# Patient Record
Sex: Male | Born: 1960 | Race: White | Hispanic: No | Marital: Married | Smoking: Current every day smoker
Health system: Southern US, Community
[De-identification: ages and names within clinical notes are randomized; demographics above are authoritative.]

## PROBLEM LIST (undated history)

## (undated) DIAGNOSIS — I2510 Atherosclerotic heart disease of native coronary artery without angina pectoris: Secondary | ICD-10-CM

## (undated) DIAGNOSIS — I10 Essential (primary) hypertension: Secondary | ICD-10-CM

## (undated) DIAGNOSIS — E119 Type 2 diabetes mellitus without complications: Secondary | ICD-10-CM

## (undated) DIAGNOSIS — Z955 Presence of coronary angioplasty implant and graft: Secondary | ICD-10-CM

## (undated) DIAGNOSIS — J449 Chronic obstructive pulmonary disease, unspecified: Secondary | ICD-10-CM

## (undated) HISTORY — PX: CARDIAC SURGERY: SHX584

## (undated) HISTORY — PX: ANKLE SURGERY: SHX546

---

## 2014-07-11 ENCOUNTER — Emergency Department (HOSPITAL_BASED_OUTPATIENT_CLINIC_OR_DEPARTMENT_OTHER): Payer: Managed Care, Other (non HMO)

## 2014-07-11 ENCOUNTER — Encounter (HOSPITAL_BASED_OUTPATIENT_CLINIC_OR_DEPARTMENT_OTHER): Payer: Self-pay | Admitting: *Deleted

## 2014-07-11 ENCOUNTER — Emergency Department (HOSPITAL_BASED_OUTPATIENT_CLINIC_OR_DEPARTMENT_OTHER)
Admission: EM | Admit: 2014-07-11 | Discharge: 2014-07-11 | Disposition: A | Discharge status: Home / Self Care | Payer: Managed Care, Other (non HMO) | Attending: Emergency Medicine | Admitting: Emergency Medicine

## 2014-07-11 DIAGNOSIS — J209 Acute bronchitis, unspecified: Secondary | ICD-10-CM | POA: Insufficient documentation

## 2014-07-11 DIAGNOSIS — Z720 Tobacco use: Secondary | ICD-10-CM | POA: Diagnosis not present

## 2014-07-11 DIAGNOSIS — J40 Bronchitis, not specified as acute or chronic: Secondary | ICD-10-CM

## 2014-07-11 DIAGNOSIS — R05 Cough: Secondary | ICD-10-CM | POA: Diagnosis present

## 2014-07-11 DIAGNOSIS — R062 Wheezing: Secondary | ICD-10-CM

## 2014-07-11 MED ORDER — ALBUTEROL SULFATE HFA 108 (90 BASE) MCG/ACT IN AERS
2.0000 | INHALATION_SPRAY | RESPIRATORY_TRACT | Status: DC | PRN
  Administered 2014-07-11: 2 via RESPIRATORY_TRACT
  Filled 2014-07-11: qty 6.7

## 2014-07-11 MED ORDER — ALBUTEROL SULFATE HFA 108 (90 BASE) MCG/ACT IN AERS: 2.0000 | INHALATION_SPRAY | RESPIRATORY_TRACT | Status: AC | PRN

## 2014-07-11 MED ORDER — ALBUTEROL SULFATE (2.5 MG/3ML) 0.083% IN NEBU
INHALATION_SOLUTION | RESPIRATORY_TRACT | Status: AC
  Administered 2014-07-11: 2.5 mg
  Filled 2014-07-11: qty 3

## 2014-07-11 MED ORDER — AZITHROMYCIN 250 MG PO TABS: 250.0000 mg | ORAL_TABLET | Freq: Every day | ORAL | Status: DC

## 2014-07-11 MED ORDER — HYDROCODONE-HOMATROPINE 5-1.5 MG/5ML PO SYRP: 5.0000 mL | ORAL_SOLUTION | Freq: Four times a day (QID) | ORAL | Status: AC | PRN

## 2014-07-11 MED ORDER — IPRATROPIUM-ALBUTEROL 0.5-2.5 (3) MG/3ML IN SOLN
RESPIRATORY_TRACT | Status: AC
  Administered 2014-07-11: 3 mL
  Filled 2014-07-11: qty 3

## 2014-07-11 NOTE — ED Notes (Signed)
Pt directed to pharmacy to pick up medications- inhaler given with instructions for use by RT

## 2014-07-11 NOTE — ED Notes (Signed)
Beaton MD at bedside. 

## 2014-07-11 NOTE — ED Provider Notes (Signed)
CSN: [id number]     Arrival date & time 07/11/14  1349 History   First MD Initiated Contact with Patient 07/11/14 1548     Chief Complaint  Patient presents with  . Cough  . Chest Pain      HPI States he has a productive cough green sputum and pain in his right lung for a week. Back pain into his left leg. Hx of pinched nerve that he was suppose to have back surgery for but has to wait for insurance approval. History reviewed. No pertinent past medical history. History reviewed. No pertinent past surgical history. No family history on file. History  Substance Use Topics  . Smoking status: Current Every Day Smoker -- 1.00 packs/day    Types: Cigarettes  . Smokeless tobacco: Not on file  . Alcohol Use: No    Review of Systems  All other systems reviewed and are negative.     Allergies  Review of patient's allergies indicates not on file.  Home Medications   Prior to Admission medications   Medication Sig Start Date End Date Taking? Authorizing Provider  azithromycin (ZITHROMAX) 250 MG tablet Take 1 tablet (250 mg total) by mouth daily. Take first 2 tablets together, then 1 every day until finished. 07/11/14   Nelva Nayobert Jalik Gellatly, MD  HYDROcodone-homatropine [facility](HYCODAN) 5-1.5 MG/5ML syrup Take 5 mLs by mouth every 6 (six) hours as needed for cough. 07/11/14   Nelva Nayobert Enez Monahan, MD   BP 194/94 mmHg  Pulse 94  Temp(Src) 97.7 F (36.5 C) (Oral)  Resp 22  Ht 5\' 10"  (1.778 m)  Wt 265 lb (120.203 kg)  BMI 38.02 kg/m2  SpO2 96% Physical Exam  Constitutional: He is oriented to person, place, and time. He appears well-developed and well-nourished. No distress.  HENT:  Head: Normocephalic and atraumatic.  Eyes: Pupils are equal, round, and reactive to light.  Neck: Normal range of motion.  Cardiovascular: Normal rate and intact distal pulses.   Pulmonary/Chest: No respiratory distress. He has wheezes.  Abdominal: Normal appearance. He exhibits no distension.  Musculoskeletal: Normal range  of motion.  Neurological: He is alert and oriented to person, place, and time. No cranial nerve deficit.  Skin: Skin is warm and dry. No rash noted.  Psychiatric: He has a normal mood and affect. His behavior is normal.  Nursing note and vitals reviewed.   ED Course  Procedures (including critical care time)  Medications  albuterol (PROVENTIL HFA;VENTOLIN HFA) 108 (90 BASE) MCG/ACT inhaler 2 puff (not administered)  albuterol (PROVENTIL) (2.5 MG/3ML) 0.083% nebulizer solution (2.5 mg  Given 07/11/14 1546)  ipratropium-albuterol (DUONEB) 0.5-2.5 (3) MG/3ML nebulizer solution (3 mLs  Given 07/11/14 1547)    Labs Review Labs Reviewed - No data to display  Imaging Review Dg Chest 2 View  07/11/2014   CLINICAL DATA:  Cough productive of green sputum, congestion, and RIGHT chest pain for 1 week, history smoking  EXAM: CHEST  2 VIEW  COMPARISON:  None  FINDINGS: Normal heart size, mediastinal contours, and pulmonary vascularity.  Mild peribronchial thickening.  No pulmonary infiltrate, pleural effusion, or pneumothorax.  Bones unremarkable.  IMPRESSION: Mild bronchitic changes without infiltrate.   Electronically Signed   By: Ulyses SouthwardMark  Boles M.D.   On: 07/11/2014 14:20     EKG Interpretation   Date/Time:  Thursday July 11 2014 14:01:33 EDT Ventricular Rate:  91 PR Interval:  172 QRS Duration: 98 QT Interval:  374 QTC Calculation: 460 R Axis:   74 Text Interpretation:  Normal sinus  rhythm Normal ECG Confirmed by Radford Pax   MD, Izel Hochberg ([id number]) on 07/11/2014 3:48:45 PM      MDM   Final diagnoses:  Bronchitis  Wheezing      Nelva Nay, MD 07/11/14 [phone]

## 2014-07-11 NOTE — ED Notes (Signed)
States he has a productive cough green sputum and pain in his right lung for a week. Back pain into his left leg. Hx of pinched nerve that he was suppose to have back surgery for but has to wait for insurance approval.

## 2016-08-28 ENCOUNTER — Other Ambulatory Visit: Payer: Self-pay

## 2016-08-28 ENCOUNTER — Emergency Department (HOSPITAL_BASED_OUTPATIENT_CLINIC_OR_DEPARTMENT_OTHER): Payer: Managed Care, Other (non HMO)

## 2016-08-28 ENCOUNTER — Emergency Department (HOSPITAL_BASED_OUTPATIENT_CLINIC_OR_DEPARTMENT_OTHER)
Admission: EM | Admit: 2016-08-28 | Discharge: 2016-08-28 | Disposition: A | Discharge status: Home / Self Care | Payer: Managed Care, Other (non HMO) | Attending: Emergency Medicine | Admitting: Emergency Medicine

## 2016-08-28 ENCOUNTER — Encounter (HOSPITAL_BASED_OUTPATIENT_CLINIC_OR_DEPARTMENT_OTHER): Payer: Self-pay | Admitting: Emergency Medicine

## 2016-08-28 DIAGNOSIS — Z79899 Other long term (current) drug therapy: Secondary | ICD-10-CM | POA: Diagnosis not present

## 2016-08-28 DIAGNOSIS — Z7984 Long term (current) use of oral hypoglycemic drugs: Secondary | ICD-10-CM | POA: Diagnosis not present

## 2016-08-28 DIAGNOSIS — I10 Essential (primary) hypertension: Secondary | ICD-10-CM | POA: Diagnosis not present

## 2016-08-28 DIAGNOSIS — E119 Type 2 diabetes mellitus without complications: Secondary | ICD-10-CM | POA: Insufficient documentation

## 2016-08-28 DIAGNOSIS — F17210 Nicotine dependence, cigarettes, uncomplicated: Secondary | ICD-10-CM | POA: Insufficient documentation

## 2016-08-28 DIAGNOSIS — R0602 Shortness of breath: Secondary | ICD-10-CM | POA: Diagnosis present

## 2016-08-28 DIAGNOSIS — J441 Chronic obstructive pulmonary disease with (acute) exacerbation: Secondary | ICD-10-CM

## 2016-08-28 HISTORY — DX: Essential (primary) hypertension: I10

## 2016-08-28 HISTORY — DX: Chronic obstructive pulmonary disease, unspecified: J44.9

## 2016-08-28 HISTORY — DX: Type 2 diabetes mellitus without complications: E11.9

## 2016-08-28 LAB — BASIC METABOLIC PANEL
Anion gap: 9 (ref 5–15)
BUN: 10 mg/dL (ref 6–20)
CO2: 30 mmol/L (ref 22–32)
Calcium: 9.2 mg/dL (ref 8.9–10.3)
Chloride: 100 mmol/L — ABNORMAL LOW (ref 101–111)
Creatinine, Ser: 0.72 mg/dL (ref 0.61–1.24)
GFR calc Af Amer: >60 mL/min (ref >60)
Glucose, Bld: 147 mg/dL — ABNORMAL HIGH (ref 65–99)
POTASSIUM: 3.8 mmol/L (ref 3.5–5.1)
Sodium: 139 mmol/L (ref 135–145)

## 2016-08-28 LAB — CBC WITH DIFFERENTIAL/PLATELET
BASOS ABS: 0 10*3/uL (ref 0.0–0.1)
Basophils Relative: 0 %
Eosinophils Absolute: 0.3 10*3/uL (ref 0.0–0.7)
Eosinophils Relative: 2 %
HEMATOCRIT: 44.3 % (ref 39.0–52.0)
Hemoglobin: 15.4 g/dL (ref 13.0–17.0)
LYMPHS ABS: 2.3 10*3/uL (ref 0.7–4.0)
LYMPHS PCT: 14 %
MCH: 30.4 pg (ref 26.0–34.0)
MCHC: 34.8 g/dL (ref 30.0–36.0)
MCV: 87.5 fL (ref 78.0–100.0)
MONO ABS: 1.6 10*3/uL — AB (ref 0.1–1.0)
MONOS PCT: 10 %
NEUTROS ABS: 12.1 10*3/uL — AB (ref 1.7–7.7)
Neutrophils Relative %: 74 %
Platelets: 314 10*3/uL (ref 150–400)
RBC: 5.06 MIL/uL (ref 4.22–5.81)
RDW: 14 % (ref 11.5–15.5)
WBC: 16.2 10*3/uL — ABNORMAL HIGH (ref 4.0–10.5)

## 2016-08-28 LAB — TROPONIN I: Troponin I: <0.03 ng/mL (ref <0.03)

## 2016-08-28 MED ORDER — IPRATROPIUM-ALBUTEROL 0.5-2.5 (3) MG/3ML IN SOLN
3.0000 mL | Freq: Once | RESPIRATORY_TRACT | Status: AC
  Administered 2016-08-28: 3 mL via RESPIRATORY_TRACT
  Filled 2016-08-28: qty 3

## 2016-08-28 MED ORDER — PREDNISONE 50 MG PO TABS
60.0000 mg | ORAL_TABLET | Freq: Once | ORAL | Status: AC
  Administered 2016-08-28: 60 mg via ORAL
  Filled 2016-08-28: qty 1

## 2016-08-28 MED ORDER — BENZONATATE 100 MG PO CAPS
100.0000 mg | ORAL_CAPSULE | Freq: Once | ORAL | Status: AC
  Administered 2016-08-28: 100 mg via ORAL
  Filled 2016-08-28: qty 1

## 2016-08-28 MED ORDER — IPRATROPIUM-ALBUTEROL 0.5-2.5 (3) MG/3ML IN SOLN
RESPIRATORY_TRACT | Status: AC
  Administered 2016-08-28: 3 mL
  Filled 2016-08-28: qty 3

## 2016-08-28 MED ORDER — ALBUTEROL SULFATE HFA 108 (90 BASE) MCG/ACT IN AERS: 1.0000 | INHALATION_SPRAY | Freq: Four times a day (QID) | RESPIRATORY_TRACT | 0 refills | Status: AC | PRN

## 2016-08-28 MED ORDER — ALBUTEROL SULFATE (2.5 MG/3ML) 0.083% IN NEBU
INHALATION_SOLUTION | RESPIRATORY_TRACT | Status: AC
  Administered 2016-08-28: 2.5 mg
  Filled 2016-08-28: qty 3

## 2016-08-28 MED ORDER — BENZONATATE 100 MG PO CAPS: 100.0000 mg | ORAL_CAPSULE | Freq: Three times a day (TID) | ORAL | 0 refills | Status: AC

## 2016-08-28 MED ORDER — AZITHROMYCIN 250 MG PO TABS: 250.0000 mg | ORAL_TABLET | Freq: Every day | ORAL | 0 refills | Status: DC

## 2016-08-28 MED ORDER — PREDNISONE 20 MG PO TABS: 40.0000 mg | ORAL_TABLET | Freq: Every day | ORAL | 0 refills | Status: DC

## 2016-08-28 NOTE — Discharge Instructions (Signed)
All of your labs and imaging has been reassuring. This is likely an exacerbation of her COPD. Take Tessalon for cough. Take the azithromycin which is antibiotic. Use the albuterol inhaler as needed. Take prednisone starting tomorrow for 3 days. Make sure you keep an eye on your sugars. Return if your symptoms worsen.

## 2016-08-28 NOTE — ED Notes (Signed)
RT called for eval due to wheezing.

## 2016-08-28 NOTE — ED Triage Notes (Signed)
SOB x 1 week

## 2016-08-28 NOTE — ED Notes (Signed)
Sat 92-94% while walking. No SOB noted.

## 2016-08-29 NOTE — ED Provider Notes (Signed)
[facility]-EMERGENCY DEPT Provider Note   CSN: [id number] Arrival date & time: 08/28/16  1110     History   Chief Complaint Chief Complaint  Patient presents with  . Shortness of Breath    HPI George Golden is a 56 y.o. male.  HPI 56 year old Caucasian male past medical history significant for hypertension, diabetes, COPD, tobacco abuse, chronic bronchitis presents to the emergency Department today with complaints of productive coughwith thick yellow sputum that has increased over the past week. Patient states that for the past week he's had this productive cough with increased amount of sputum production. Patient also states that he has chest congestion and mild shortness of breath associated with the congestion. He denies any chest pain. States that he gets chronic bronchitis once a year and this feels very similar. Feels like he needs a breathing treatment.patient denies any sick contacts. Does endorse nasal congestion and rhinorrhea. Patient denies any fever, chills, shortness of breath, lightheadedness, dizziness, nausea, vomiting, abdominal pain, urinary symptoms, sick contacts, lower extremity edema. Patient denies any history of DVT/PE, prolonged immobilizations, recent hospitalizations/surgeries, lower extremity  edema or calf tenderness.  Patient states he is a multi pack year smoker and that this is contributing to his symptoms. He is trying to quit smoking. Past Medical History:  Diagnosis Date  . COPD (chronic obstructive pulmonary disease) (HCC)   . Diabetes mellitus without complication (HCC)   . Hypertension     There are no active problems to display for this patient.   History reviewed. No pertinent surgical history.     Home Medications    Prior to Admission medications   Medication Sig Start Date End Date Taking? Authorizing Provider  albuterol (PROVENTIL HFA;VENTOLIN HFA) 108 (90 BASE) MCG/ACT inhaler Inhale 2 puffs into the lungs every 4 (four) hours as  needed for wheezing or shortness of breath. 07/11/14  Yes Nelva NayBeaton, Robert, MD  atorvastatin (LIPITOR) 20 MG tablet Take 20 mg by mouth daily.   Yes [provider]  lisinopril (PRINIVIL,ZESTRIL) 10 MG tablet Take 10 mg by mouth daily.   Yes [provider]  metFORMIN (GLUCOPHAGE) 500 MG tablet Take by mouth 2 (two) times daily with a meal.   Yes [provider]  metoprolol tartrate (LOPRESSOR) 25 MG tablet Take 25 mg by mouth 2 (two) times daily.   Yes [provider]  albuterol (PROVENTIL HFA;VENTOLIN HFA) 108 (90 Base) MCG/ACT inhaler Inhale 1-2 puffs into the lungs every 6 (six) hours as needed for wheezing or shortness of breath. 08/28/16   Leaphart, Lynann BeaverKenneth T, PA-C  azithromycin (ZITHROMAX) 250 MG tablet Take 1 tablet (250 mg total) by mouth daily. Take first 2 tablets together, then 1 every day until finished. 08/28/16   Rise MuLeaphart, Kenneth T, PA-C  benzonatate (TESSALON) 100 MG capsule Take 1 capsule (100 mg total) by mouth every 8 (eight) hours. 08/28/16   Rise MuLeaphart, Kenneth T, PA-C  HYDROcodone-homatropine (HYCODAN) 5-1.5 MG/5ML syrup Take 5 mLs by mouth every 6 (six) hours as needed for cough. 07/11/14   Nelva NayBeaton, Robert, MD  predniSONE (DELTASONE) 20 MG tablet Take 2 tablets (40 mg total) by mouth daily with breakfast. 08/28/16   Rise MuLeaphart, Kenneth T, PA-C    Family History No family history on file.  Social History Social History  Substance Use Topics  . Smoking status: Current Every Day Smoker    Packs/day: 1.00    Types: Cigarettes  . Smokeless tobacco: Never Used  . Alcohol use No     Allergies  Patient has no known allergies.   Review of Systems Review of Systems  Constitutional: Negative for chills and fever.  HENT: Positive for congestion and rhinorrhea. Negative for sore throat.   Eyes: Negative for visual disturbance.  Respiratory: Positive for cough, shortness of breath and wheezing.   Cardiovascular: Negative for chest pain, palpitations  and leg swelling.  Gastrointestinal: Negative for abdominal pain, nausea and vomiting.  Genitourinary: Negative for dysuria, flank pain, frequency, hematuria and urgency.  Musculoskeletal: Negative for back pain.  Neurological: Negative for dizziness, syncope, weakness, light-headedness and headaches.     Physical Exam Updated Vital Signs BP (!) 168/75   Pulse 80   Temp 98.8 F (37.1 C) (Oral)   Resp (!) 31   Ht 5\' 10"  (1.778 m)   Wt 124.7 kg (275 lb)   SpO2 93%   BMI 39.46 kg/m   Physical Exam  Constitutional: He appears well-developed and well-nourished. No distress.  Patient is a well-appearing male in no acute distress and nontoxic-appearing.  HENT:  Head: Normocephalic and atraumatic.  Right Ear: Tympanic membrane, external ear and ear canal normal.  Left Ear: Tympanic membrane, external ear and ear canal normal.  Nose: Mucosal edema and rhinorrhea present.  Mouth/Throat: Uvula is midline, oropharynx is clear and moist and mucous membranes are normal.  Eyes: Conjunctivae are normal. Right eye exhibits no discharge. Left eye exhibits no discharge. No scleral icterus.  Neck: Normal range of motion. Neck supple. No thyromegaly present.  Cardiovascular: Normal rate, regular rhythm, normal heart sounds and intact distal pulses.  Exam reveals no gallop and no friction rub.   No murmur heard. Pulmonary/Chest: Effort normal. No respiratory distress. He has wheezes. He has no rales. He exhibits no tenderness.  Diffuse expiratory wheezes noted along with diffuse coarse sounds throughout all lung fields. Productive cough of thick yellow sputum noted.  Abdominal: Soft. Bowel sounds are normal. He exhibits no distension. There is no tenderness. There is no rebound and no guarding.  Musculoskeletal: Normal range of motion.  Lymphadenopathy:    He has no cervical adenopathy.  Neurological: He is alert.  Skin: Skin is warm and dry. Capillary refill takes less than 2 seconds.  Nursing  note and vitals reviewed.    ED Treatments / Results  Labs (all labs ordered are listed, but only abnormal results are displayed) Labs Reviewed  BASIC METABOLIC PANEL - Abnormal; Notable for the following:       Result Value   Chloride 100 (*)    Glucose, Bld 147 (*)    All other components within normal limits  CBC WITH DIFFERENTIAL/PLATELET - Abnormal; Notable for the following:    WBC 16.2 (*)    Neutro Abs 12.1 (*)    Monocytes Absolute 1.6 (*)    All other components within normal limits  TROPONIN I    EKG  EKG Interpretation None       Radiology Dg Chest 2 View  Result Date: 08/28/2016 CLINICAL DATA:  Wheezing, cough and congestion for 1 week, hypertension, diabetes mellitus, COPD EXAM: CHEST  2 VIEW COMPARISON:  07/11/2014 FINDINGS: Normal heart size, mediastinal contours, and pulmonary vascularity. Mild chronic bronchitic changes. Lungs otherwise clear. No pleural effusion or pneumothorax. No acute bony abnormalities. IMPRESSION: Mild chronic bronchitic changes without infiltrate. Electronically Signed   By: Ulyses Southward M.D.   On: 08/28/2016 11:46    Procedures Procedures (including critical care time)  Medications Ordered in ED Medications  albuterol (PROVENTIL) (2.5 MG/3ML) 0.083% nebulizer solution (2.5  mg  Given 08/28/16 1122)  ipratropium-albuterol (DUONEB) 0.5-2.5 (3) MG/3ML nebulizer solution (3 mLs  Given 08/28/16 1122)  ipratropium-albuterol (DUONEB) 0.5-2.5 (3) MG/3ML nebulizer solution 3 mL (3 mLs Nebulization Given 08/28/16 1311)  predniSONE (DELTASONE) tablet 60 mg (60 mg Oral Given 08/28/16 1257)  benzonatate (TESSALON) capsule 100 mg (100 mg Oral Given 08/28/16 1257)     Initial Impression / Assessment and Plan / ED Course  I have reviewed the triage vital signs and the nursing notes.  Pertinent labs & imaging results that were available during my care of the patient were reviewed by me and considered in my medical decision making (see chart for  details).     Patient presents to the emergency department today with history of COPD, chronic tobacco use, chronic bronchitis for productive cough, chest congestion, rhinorrhea, increased shortness of breath over the past week. Patient denies any chest pain or fevers. On exam the patient does have diffuse wheezes and course sounds noted bilaterally otherwise patient is well-appearing.  Chest x-ray shows chronic bronchitic changes without overlying infiltrate. Patient was given 2 DuoNeb with complete resolution of the wheezing. Patient does have thick yellow sputum production with his cough.labs revealed a mild leukocytosis of 16,000. All other labs are at baseline. Troponin is negative. EKG shows no ischemic changes as reviewed by myself and Dr. Madilyn Hook. Doubt ACS. Patient is low risk for DVT/PE however he has PERC positive due to age and sp52. The patient is a chronic smoker and his SPO2 is likely in the low 90s. Has no other risk factors low suspicion for PE/DVT at this time. Patient feels much improved after breathing treatment, steroids and cough medicine, He was able to ambulate in the department with saturations above 92% on room air and feels like his breathing is back to baseline. Feels ready for discharge.  This is likely chronic bronchitis. He'll be treated with steroids, albuterol inhaler, antibiotics. Counseled patient on need for cessation of tobacco use. The patient is nontoxic appearing. Vital signs remained stable.Patient verbalized understanding the plan of care. All questions were answered prior to discharge. He has been given strict return precautions. Patient discussed with Dr. Madilyn Hook who is agreeable to the above plan.  Final Clinical Impressions(s) / ED Diagnoses   Final diagnoses:  COPD exacerbation (HCC)    New Prescriptions Discharge Medication List as of 08/28/2016  2:39 PM    START taking these medications   Details  !! albuterol (PROVENTIL HFA;VENTOLIN HFA) 108 (90 Base)  MCG/ACT inhaler Inhale 1-2 puffs into the lungs every 6 (six) hours as needed for wheezing or shortness of breath., Starting Sat 08/28/2016, Print    benzonatate (TESSALON) 100 MG capsule Take 1 capsule (100 mg total) by mouth every 8 (eight) hours., Starting Sat 08/28/2016, Print    predniSONE (DELTASONE) 20 MG tablet Take 2 tablets (40 mg total) by mouth daily with breakfast., Starting Sat 08/28/2016, Print     !! - Potential duplicate medications found. Please discuss with provider.       Rise Mu, PA-C 08/29/16 0855    Tilden Fossa, MD 08/29/16 [phone]

## 2019-12-30 ENCOUNTER — Emergency Department (HOSPITAL_BASED_OUTPATIENT_CLINIC_OR_DEPARTMENT_OTHER): Payer: Self-pay

## 2019-12-30 ENCOUNTER — Encounter (HOSPITAL_BASED_OUTPATIENT_CLINIC_OR_DEPARTMENT_OTHER): Payer: Self-pay | Admitting: Emergency Medicine

## 2019-12-30 ENCOUNTER — Other Ambulatory Visit: Payer: Self-pay

## 2019-12-30 ENCOUNTER — Emergency Department (HOSPITAL_BASED_OUTPATIENT_CLINIC_OR_DEPARTMENT_OTHER)
Admission: EM | Admit: 2019-12-30 | Discharge: 2019-12-30 | Disposition: A | Discharge status: Home / Self Care | Payer: Self-pay | Attending: Emergency Medicine | Admitting: Emergency Medicine

## 2019-12-30 DIAGNOSIS — F17210 Nicotine dependence, cigarettes, uncomplicated: Secondary | ICD-10-CM | POA: Insufficient documentation

## 2019-12-30 DIAGNOSIS — Z79899 Other long term (current) drug therapy: Secondary | ICD-10-CM | POA: Insufficient documentation

## 2019-12-30 DIAGNOSIS — E119 Type 2 diabetes mellitus without complications: Secondary | ICD-10-CM | POA: Insufficient documentation

## 2019-12-30 DIAGNOSIS — Z7984 Long term (current) use of oral hypoglycemic drugs: Secondary | ICD-10-CM | POA: Insufficient documentation

## 2019-12-30 DIAGNOSIS — I10 Essential (primary) hypertension: Secondary | ICD-10-CM | POA: Insufficient documentation

## 2019-12-30 DIAGNOSIS — J441 Chronic obstructive pulmonary disease with (acute) exacerbation: Secondary | ICD-10-CM | POA: Insufficient documentation

## 2019-12-30 LAB — CBC
HCT: 43.6 % (ref 39.0–52.0)
Hemoglobin: 14.1 g/dL (ref 13.0–17.0)
MCH: 29.3 pg (ref 26.0–34.0)
MCHC: 32.3 g/dL (ref 30.0–36.0)
MCV: 90.5 fL (ref 80.0–100.0)
Platelets: 198 10*3/uL (ref 150–400)
RBC: 4.82 MIL/uL (ref 4.22–5.81)
RDW: 14.9 % (ref 11.5–15.5)
WBC: 11.2 10*3/uL — ABNORMAL HIGH (ref 4.0–10.5)
nRBC: 0 % (ref 0.0–0.2)

## 2019-12-30 LAB — BASIC METABOLIC PANEL
Anion gap: 9 (ref 5–15)
BUN: 18 mg/dL (ref 6–20)
CO2: 28 mmol/L (ref 22–32)
Calcium: 8.8 mg/dL — ABNORMAL LOW (ref 8.9–10.3)
Chloride: 103 mmol/L (ref 98–111)
Creatinine, Ser: 0.92 mg/dL (ref 0.61–1.24)
GFR calc Af Amer: >60 mL/min (ref >60)
GFR calc non Af Amer: >60 mL/min (ref >60)
Glucose, Bld: 144 mg/dL — ABNORMAL HIGH (ref 70–99)
Potassium: 3.9 mmol/L (ref 3.5–5.1)
Sodium: 140 mmol/L (ref 135–145)

## 2019-12-30 LAB — TROPONIN I (HIGH SENSITIVITY): Troponin I (High Sensitivity): 4 ng/L (ref <18)

## 2019-12-30 MED ORDER — METHYLPREDNISOLONE SODIUM SUCC 125 MG IJ SOLR
125.0000 mg | Freq: Once | INTRAMUSCULAR | Status: AC
  Administered 2019-12-30: 125 mg via INTRAVENOUS
  Filled 2019-12-30: qty 2

## 2019-12-30 MED ORDER — AZITHROMYCIN 250 MG PO TABS: 250.0000 mg | ORAL_TABLET | Freq: Every day | ORAL | 0 refills | Status: AC

## 2019-12-30 MED ORDER — IPRATROPIUM BROMIDE HFA 17 MCG/ACT IN AERS
4.0000 | INHALATION_SPRAY | Freq: Once | RESPIRATORY_TRACT | Status: AC
  Administered 2019-12-30: 4 via RESPIRATORY_TRACT
  Filled 2019-12-30: qty 12.9

## 2019-12-30 MED ORDER — AEROCHAMBER PLUS FLO-VU MEDIUM MISC
1.0000 | Freq: Once | Status: AC
  Administered 2019-12-30: 1
  Filled 2019-12-30: qty 1

## 2019-12-30 MED ORDER — AZITHROMYCIN 250 MG PO TABS
500.0000 mg | ORAL_TABLET | Freq: Once | ORAL | Status: AC
  Administered 2019-12-30: 500 mg via ORAL
  Filled 2019-12-30: qty 2

## 2019-12-30 MED ORDER — ALBUTEROL SULFATE HFA 108 (90 BASE) MCG/ACT IN AERS
8.0000 | INHALATION_SPRAY | Freq: Once | RESPIRATORY_TRACT | Status: AC
  Administered 2019-12-30: 8 via RESPIRATORY_TRACT
  Filled 2019-12-30: qty 6.7

## 2019-12-30 MED ORDER — PREDNISONE 20 MG PO TABS: 40.0000 mg | ORAL_TABLET | Freq: Every day | ORAL | 0 refills | Status: AC

## 2019-12-30 NOTE — Discharge Instructions (Signed)
Please read instructions below. Take the antibiotic, azithromycin, starting tomorrow, once daily until they are gone. Starting tomorrow, take the prednisone daily. Monitor your blood sugar while taking. You can use your albuterol inhaler as needed for wheezing or shortness of breath. Schedule an appointment with your primary care provider to follow up on your visit today. Return to the ER for worsening shortness of breath, if your oxygen percentage drops below 90%, chest pain on exertion, fever, or new or worsening symptoms.

## 2019-12-30 NOTE — ED Triage Notes (Signed)
Reports chest congestion with SOB for the last three days.  Hx of COPD.

## 2019-12-30 NOTE — ED Provider Notes (Addendum)
[facility] EMERGENCY DEPARTMENT Provider Note   CSN: [id number] Arrival date & time: 12/30/19  1855     History Chief Complaint  Patient presents with  . Shortness of Breath    George Golden is a 59 y.o. male past medical history of COPD, diabetes, hypertension, presenting the emergency department with complaint of 3 days of congested cough and shortness of breath.  Patient states symptoms feel very similar to COPD exacerbation for bronchitis.  He states he has this couple of times per year.  He denies fevers or chills though he has a general feeling of unwell.  He has no chest pain.  He feels congested in his chest, his cough is productive of yellow sputum.  He states generally steroids and antibiotics help him through these exacerbations.  He has had no Covid exposures.  He is vaccinated against Covid.  The history is provided by the patient.       Past Medical History:  Diagnosis Date  . COPD (chronic obstructive pulmonary disease) (HCC)   . Diabetes mellitus without complication (HCC)   . Hypertension     There are no problems to display for this patient.   History reviewed. No pertinent surgical history.     No family history on file.  Social History   Tobacco Use  . Smoking status: Current Every Day Smoker    Packs/day: 1.00    Types: Cigarettes  . Smokeless tobacco: Never Used  Substance Use Topics  . Alcohol use: No  . Drug use: No    Home Medications Prior to Admission medications   Medication Sig Start Date End Date Taking? Authorizing Provider  albuterol (PROVENTIL HFA;VENTOLIN HFA) 108 (90 BASE) MCG/ACT inhaler Inhale 2 puffs into the lungs every 4 (four) hours as needed for wheezing or shortness of breath. 07/11/14   Nelva Nay, MD  albuterol (PROVENTIL HFA;VENTOLIN HFA) 108 (90 Base) MCG/ACT inhaler Inhale 1-2 puffs into the lungs every 6 (six) hours as needed for wheezing or shortness of breath. 08/28/16   Leaphart, Lynann Beaver, PA-C    atorvastatin (LIPITOR) 20 MG tablet Take 20 mg by mouth daily.    [provider]  azithromycin (ZITHROMAX) 250 MG tablet Take 1 tablet (250 mg total) by mouth daily. Take 1 every day until finished. 12/31/19   Kimbly Eanes, [place] N, PA-C  benzonatate (TESSALON) 100 MG capsule Take 1 capsule (100 mg total) by mouth every 8 (eight) hours. 08/28/16   Rise Mu, PA-C  HYDROcodone-homatropine (HYCODAN) 5-1.5 MG/5ML syrup Take 5 mLs by mouth every 6 (six) hours as needed for cough. 07/11/14   Nelva Nay, MD  lisinopril (PRINIVIL,ZESTRIL) 10 MG tablet Take 10 mg by mouth daily.    [provider]  metFORMIN (GLUCOPHAGE) 500 MG tablet Take by mouth 2 (two) times daily with a meal.    [provider]  metoprolol tartrate (LOPRESSOR) 25 MG tablet Take 25 mg by mouth 2 (two) times daily.    [provider]  predniSONE (DELTASONE) 20 MG tablet Take 2 tablets (40 mg total) by mouth daily with breakfast for 4 days. 12/31/19 01/04/20  Jacobs Golab, [place] N, PA-C    Allergies    Patient has no known allergies.  Review of Systems   Review of Systems  Respiratory: Positive for cough, chest tightness and shortness of breath.   All other systems reviewed and are negative.   Physical Exam Updated Vital Signs BP (!) 190/81 (BP Location: Right Arm)   Pulse 82  Temp 97.9 F (36.6 C) (Oral)   Resp 18   Ht 5\' 10"  (1.778 m)   Wt 108.9 kg   SpO2 96%   BMI 34.44 kg/m   Physical Exam Vitals and nursing note reviewed.  Constitutional:      General: He is not in acute distress.    Appearance: He is well-developed. He is not ill-appearing.  HENT:     Head: Normocephalic and atraumatic.  Eyes:     Conjunctiva/sclera: Conjunctivae normal.  Cardiovascular:     Rate and Rhythm: Normal rate and regular rhythm.  Pulmonary:     Effort: Pulmonary effort is normal. No respiratory distress.     Comments: Diffuse wheezes throughout, lung sounds slightly diminished  bilaterally. Abdominal:     Palpations: Abdomen is soft.  Skin:    General: Skin is warm.  Neurological:     Mental Status: He is alert.  Psychiatric:        Behavior: Behavior normal.     ED Results / Procedures / Treatments   Labs (all labs ordered are listed, but only abnormal results are displayed) Labs Reviewed  BASIC METABOLIC PANEL - Abnormal; Notable for the following components:      Result Value   Glucose, Bld 144 (*)    Calcium 8.8 (*)    All other components within normal limits  CBC - Abnormal; Notable for the following components:   WBC 11.2 (*)    All other components within normal limits  TROPONIN I (HIGH SENSITIVITY)    EKG None  Radiology DG Chest 2 View  Result Date: 12/30/2019 CLINICAL DATA:  Shortness of breath.  Chest pain.  No fever. EXAM: CHEST - 2 VIEW COMPARISON:  June 24 21 FINDINGS: The heart size and mediastinal contours are within normal limits. Both lungs are clear. The visualized skeletal structures are unremarkable. There is a probable nipple shadow in the peripheral right lower lung zone. IMPRESSION: No active cardiopulmonary disease. Electronically Signed   By: June 26 M.D.   On: 12/30/2019 19:24    Procedures Procedures (including critical care time)  Medications Ordered in ED Medications  azithromycin (ZITHROMAX) tablet 500 mg (has no administration in time range)  albuterol (VENTOLIN HFA) 108 (90 Base) MCG/ACT inhaler 8 puff (8 puffs Inhalation Given 12/30/19 2156)  ipratropium (ATROVENT HFA) inhaler 4 puff (4 puffs Inhalation Given 12/30/19 2156)  AeroChamber Plus Flo-Vu Medium MISC 1 each (1 each Other Given 12/30/19 2156)  methylPREDNISolone sodium succinate (SOLU-MEDROL) 125 mg/2 mL injection 125 mg (125 mg Intravenous Given 12/30/19 2206)    ED Course  I have reviewed the triage vital signs and the nursing notes.  Pertinent labs & imaging results that were available during my care of the patient were reviewed by me  and considered in my medical decision making (see chart for details).    MDM Rules/Calculators/A&P                          George Golden was evaluated in Emergency Department on 12/30/2019 for the symptoms described in the history of present illness. He was evaluated in the context of the global COVID-19 pandemic, which necessitated consideration that the patient might be at risk for infection with the SARS-CoV-2 virus that causes COVID-19. Institutional protocols and algorithms that pertain to the evaluation of patients at risk for COVID-19 are in a state of rapid change based on information released by regulatory bodies including the CDC and federal and  state organizations. These policies and algorithms were followed during the patient's care in the ED.   Patient presenting with shortness of breath and productive cough for the last 3 days, likely with COPD exacerbation.  He has similar episodes a couple times per year, today feels the same.  No fevers or systemic symptoms.  Chest x-ray is negative for pneumonia.  Lung exam with diffuse wheezing, diminished breath sounds bilaterally.  He is treated with Solu-Medrol, albuterol and Atrovent with improvement in symptoms. Lungs sounds with improvement. He is saturating 95% on room air with ambulation and no increased respiratory effort.  Patient be discharged with azithromycin and short course of prednisone.  He is instructed to follow close with PCP.  Discussed reasons return to the ED. patient states he is a pulse oximeter at home to monitor his symptoms.  Offered Covid test, patient declined.  Discussed results, findings, treatment and follow up. Patient advised of return precautions. Patient verbalized understanding and agreed with plan.  Final Clinical Impression(s) / ED Diagnoses Final diagnoses:  COPD exacerbation (HCC)    Rx / DC Orders ED Discharge Orders         Ordered    azithromycin (ZITHROMAX) 250 MG tablet  Daily        12/30/19  2245    predniSONE (DELTASONE) 20 MG tablet  Daily with breakfast        12/30/19 2245           Kynsli Haapala, [place] N, PA-C 12/30/19 2248    Brayam Boeke, [place] N, PA-C 12/30/19 2253    Benjiman Core, MD 12/30/19 2337

## 2019-12-30 NOTE — ED Notes (Signed)
Pt ambulated while wearing pulse ox, O2 was between 94-96% on room air and pulse was 100-101; pt tolerated well; EDP made aware

## 2020-09-06 ENCOUNTER — Encounter (HOSPITAL_BASED_OUTPATIENT_CLINIC_OR_DEPARTMENT_OTHER): Payer: Self-pay | Admitting: *Deleted

## 2020-09-06 ENCOUNTER — Other Ambulatory Visit: Payer: Self-pay

## 2020-09-06 ENCOUNTER — Emergency Department (HOSPITAL_BASED_OUTPATIENT_CLINIC_OR_DEPARTMENT_OTHER)
Admission: EM | Admit: 2020-09-06 | Discharge: 2020-09-06 | Disposition: A | Discharge status: Home / Self Care | Payer: BC Managed Care – PPO | Attending: Emergency Medicine | Admitting: Emergency Medicine

## 2020-09-06 ENCOUNTER — Emergency Department (HOSPITAL_BASED_OUTPATIENT_CLINIC_OR_DEPARTMENT_OTHER): Payer: BC Managed Care – PPO

## 2020-09-06 DIAGNOSIS — Z79899 Other long term (current) drug therapy: Secondary | ICD-10-CM | POA: Diagnosis not present

## 2020-09-06 DIAGNOSIS — R079 Chest pain, unspecified: Secondary | ICD-10-CM | POA: Insufficient documentation

## 2020-09-06 DIAGNOSIS — J449 Chronic obstructive pulmonary disease, unspecified: Secondary | ICD-10-CM | POA: Diagnosis not present

## 2020-09-06 DIAGNOSIS — F17210 Nicotine dependence, cigarettes, uncomplicated: Secondary | ICD-10-CM | POA: Insufficient documentation

## 2020-09-06 DIAGNOSIS — E119 Type 2 diabetes mellitus without complications: Secondary | ICD-10-CM | POA: Insufficient documentation

## 2020-09-06 DIAGNOSIS — Z955 Presence of coronary angioplasty implant and graft: Secondary | ICD-10-CM | POA: Insufficient documentation

## 2020-09-06 DIAGNOSIS — I2510 Atherosclerotic heart disease of native coronary artery without angina pectoris: Secondary | ICD-10-CM | POA: Diagnosis not present

## 2020-09-06 DIAGNOSIS — R61 Generalized hyperhidrosis: Secondary | ICD-10-CM | POA: Diagnosis not present

## 2020-09-06 DIAGNOSIS — I10 Essential (primary) hypertension: Secondary | ICD-10-CM | POA: Insufficient documentation

## 2020-09-06 DIAGNOSIS — Z7984 Long term (current) use of oral hypoglycemic drugs: Secondary | ICD-10-CM | POA: Diagnosis not present

## 2020-09-06 HISTORY — DX: Atherosclerotic heart disease of native coronary artery without angina pectoris: I25.10

## 2020-09-06 HISTORY — DX: Presence of coronary angioplasty implant and graft: Z95.5

## 2020-09-06 LAB — BASIC METABOLIC PANEL
Anion gap: 8 (ref 5–15)
BUN: 21 mg/dL — ABNORMAL HIGH (ref 6–20)
CO2: 27 mmol/L (ref 22–32)
Calcium: 9.2 mg/dL (ref 8.9–10.3)
Chloride: 102 mmol/L (ref 98–111)
Creatinine, Ser: 1.05 mg/dL (ref 0.61–1.24)
GFR, Estimated: >60 mL/min (ref >60)
Glucose, Bld: 116 mg/dL — ABNORMAL HIGH (ref 70–99)
Potassium: 4 mmol/L (ref 3.5–5.1)
Sodium: 137 mmol/L (ref 135–145)

## 2020-09-06 LAB — TROPONIN I (HIGH SENSITIVITY)
Troponin I (High Sensitivity): 2 ng/L (ref <18)
Troponin I (High Sensitivity): 3 ng/L (ref <18)

## 2020-09-06 LAB — CBC
HCT: 45.2 % (ref 39.0–52.0)
Hemoglobin: 15.2 g/dL (ref 13.0–17.0)
MCH: 30.7 pg (ref 26.0–34.0)
MCHC: 33.6 g/dL (ref 30.0–36.0)
MCV: 91.3 fL (ref 80.0–100.0)
Platelets: 237 10*3/uL (ref 150–400)
RBC: 4.95 MIL/uL (ref 4.22–5.81)
RDW: 14.6 % (ref 11.5–15.5)
WBC: 10.3 10*3/uL (ref 4.0–10.5)
nRBC: 0 % (ref 0.0–0.2)

## 2020-09-06 MED ORDER — NITROGLYCERIN 0.4 MG SL SUBL
0.4000 mg | SUBLINGUAL_TABLET | SUBLINGUAL | Status: DC | PRN
  Administered 2020-09-06: 0.4 mg via SUBLINGUAL
  Filled 2020-09-06: qty 1

## 2020-09-06 MED ORDER — ASPIRIN 81 MG PO CHEW
324.0000 mg | CHEWABLE_TABLET | Freq: Once | ORAL | Status: AC
  Administered 2020-09-06: 324 mg via ORAL
  Filled 2020-09-06: qty 4

## 2020-09-06 MED ORDER — NITROGLYCERIN 0.4 MG SL SUBL: 0.4000 mg | SUBLINGUAL_TABLET | SUBLINGUAL | 0 refills | Status: AC | PRN

## 2020-09-06 NOTE — ED Triage Notes (Signed)
Pt reports chest pain x 3 days with radiation to neck and jaw. Hx of stent placement. His brother died earlier this year from a heart attack

## 2020-09-06 NOTE — ED Notes (Signed)
ED Provider at bedside. 

## 2020-09-06 NOTE — Discharge Instructions (Signed)
Make sure to take your medications as well and aspirin daily.  Contact your cardiologist on Monday to schedule a follow-up appointment.  Return to the hospital if you have any recurrent symptoms

## 2020-09-06 NOTE — ED Notes (Signed)
Pt states Chest pain fully relieved by Nitro #1.  Pt resting comfortably.

## 2020-09-06 NOTE — ED Provider Notes (Signed)
[facility] EMERGENCY DEPARTMENT Provider Note   CSN: [id number] Arrival date & time: 09/06/20  1646     History Chief Complaint  Patient presents with  . Chest Pain    George Golden is a 60 y.o. male.  HPI  HPI: A 60 year old patient with a history of treated diabetes and hypertension presents for evaluation of chest pain. Initial onset of pain was approximately 3-6 hours ago. The patient's chest pain is sharp and is not worse with exertion. The patient reports some diaphoresis. The patient's chest pain is middle- or left-sided, is not well-localized, is not described as heaviness/pressure/tightness and does radiate to the arms/jaw/neck. The patient does not complain of nausea. The patient has smoked in the past 90 days. The patient has no history of stroke, has no history of peripheral artery disease, has no relevant family history of coronary artery disease (first degree relative at less than age 59), has no history of hypercholesterolemia and does not have an elevated BMI (>=30).  Patient states the pain initially started when he was mowing the lawn.  It has been coming and going over the last 3 days.  He did have another episode this morning at about 11 AM.  He does have history of heart disease and had a stent placed Past Medical History:  Diagnosis Date  . COPD (chronic obstructive pulmonary disease) (HCC)   . Coronary artery disease   . Diabetes mellitus without complication (HCC)   . H/O heart artery stent   . Hypertension     There are no problems to display for this patient.   Past Surgical History:  Procedure Laterality Date  . ANKLE SURGERY Right   . CARDIAC SURGERY         No family history on file.  Social History   Tobacco Use  . Smoking status: Current Every Day Smoker    Packs/day: 1.00    Types: Cigarettes  . Smokeless tobacco: Never Used  Vaping Use  . Vaping Use: Never used  Substance Use Topics  . Alcohol use: No  . Drug use: No     Home Medications Prior to Admission medications   Medication Sig Start Date End Date Taking? Authorizing Provider  nitroGLYCERIN (NITROSTAT) 0.4 MG SL tablet Place 1 tablet (0.4 mg total) under the tongue every 5 (five) minutes as needed for chest pain. 09/06/20  Yes Linwood Dibbles, MD  albuterol (PROVENTIL HFA;VENTOLIN HFA) 108 (90 BASE) MCG/ACT inhaler Inhale 2 puffs into the lungs every 4 (four) hours as needed for wheezing or shortness of breath. 07/11/14   Nelva Nay, MD  albuterol (PROVENTIL HFA;VENTOLIN HFA) 108 (90 Base) MCG/ACT inhaler Inhale 1-2 puffs into the lungs every 6 (six) hours as needed for wheezing or shortness of breath. 08/28/16   Leaphart, Lynann Beaver, PA-C  atorvastatin (LIPITOR) 20 MG tablet Take 20 mg by mouth daily.    [provider]  azithromycin (ZITHROMAX) 250 MG tablet Take 1 tablet (250 mg total) by mouth daily. Take 1 every day until finished. 12/31/19   Robinson, [place] N, PA-C  benzonatate (TESSALON) 100 MG capsule Take 1 capsule (100 mg total) by mouth every 8 (eight) hours. 08/28/16   Rise Mu, PA-C  HYDROcodone-homatropine (HYCODAN) 5-1.5 MG/5ML syrup Take 5 mLs by mouth every 6 (six) hours as needed for cough. 07/11/14   Nelva Nay, MD  lisinopril (PRINIVIL,ZESTRIL) 10 MG tablet Take 10 mg by mouth daily.    [provider]  metFORMIN (GLUCOPHAGE) 500 MG  tablet Take by mouth 2 (two) times daily with a meal.    [provider]  metoprolol tartrate (LOPRESSOR) 25 MG tablet Take 25 mg by mouth 2 (two) times daily.    [provider]    Allergies    Patient has no known allergies.  Review of Systems   Review of Systems  All other systems reviewed and are negative.   Physical Exam Updated Vital Signs BP 137/76   Pulse 63   Temp 98.1 F (36.7 C) (Oral)   Resp 16   Ht 1.778 m (5\' 10" )   Wt 112.9 kg   SpO2 100%   BMI 35.73 kg/m   Physical Exam Vitals and nursing note reviewed.  Constitutional:       General: He is not in acute distress.    Appearance: He is well-developed.  HENT:     Head: Normocephalic and atraumatic.     Right Ear: External ear normal.     Left Ear: External ear normal.  Eyes:     General: No scleral icterus.       Right eye: No discharge.        Left eye: No discharge.     Conjunctiva/sclera: Conjunctivae normal.  Neck:     Trachea: No tracheal deviation.  Cardiovascular:     Rate and Rhythm: Normal rate and regular rhythm.  Pulmonary:     Effort: Pulmonary effort is normal. No respiratory distress.     Breath sounds: Normal breath sounds. No stridor. No wheezing or rales.  Abdominal:     General: Bowel sounds are normal. There is no distension.     Palpations: Abdomen is soft.     Tenderness: There is no abdominal tenderness. There is no guarding or rebound.  Musculoskeletal:        General: No tenderness.     Cervical back: Neck supple.  Skin:    General: Skin is warm and dry.     Findings: No rash.  Neurological:     Mental Status: He is alert.     Cranial Nerves: No cranial nerve deficit (no facial droop, extraocular movements intact, no slurred speech).     Sensory: No sensory deficit.     Motor: No abnormal muscle tone or seizure activity.     Coordination: Coordination normal.     ED Results / Procedures / Treatments   Labs (all labs ordered are listed, but only abnormal results are displayed) Labs Reviewed  BASIC METABOLIC PANEL - Abnormal; Notable for the following components:      Result Value   Glucose, Bld 116 (*)    BUN 21 (*)    All other components within normal limits  CBC  TROPONIN I (HIGH SENSITIVITY)  TROPONIN I (HIGH SENSITIVITY)    EKG EKG Interpretation  Date/Time:  Saturday September 06 2020 16:55:21 EDT Ventricular Rate:  72 PR Interval:  188 QRS Duration: 114 QT Interval:  416 QTC Calculation: 456 R Axis:   73 Text Interpretation: Sinus rhythm Consider left atrial enlargement Borderline intraventricular  conduction delay No significant change since last tracing Confirmed by [id number] [phone]) on 09/06/2020 5:05:23 PM   Radiology DG Chest 2 View  Result Date: 09/06/2020 CLINICAL DATA:  Chest pain EXAM: CHEST - 2 VIEW COMPARISON:  December 30, 2019 FINDINGS: The lungs are clear. The heart size and pulmonary vascularity are normal. No adenopathy. No pneumothorax. No bone lesions. IMPRESSION: Lungs clear.  Cardiac silhouette normal. Electronically Signed   By: January 01, 2020  Margarita Grizzle III M.D.   On: 09/06/2020 17:30    Procedures Procedures   Medications Ordered in ED Medications  nitroGLYCERIN (NITROSTAT) SL tablet 0.4 mg (0.4 mg Sublingual Given 09/06/20 1716)  aspirin chewable tablet 324 mg (324 mg Oral Given 09/06/20 1715)    ED Course  I have reviewed the triage vital signs and the nursing notes.  Pertinent labs & imaging results that were available during my care of the patient were reviewed by me and considered in my medical decision making (see chart for details).  Clinical Course as of 09/06/20 2010  Sat Sep 06, 2020  1802 Initial troponin is normal. [JK]  1802 Chest x-ray without acute findings. [JK]  1802 CBC metabolic panel are normal. [JK]    Clinical Course User Index [JK] Linwood Dibbles, MD   MDM Rules/Calculators/A&P HEAR Score: 4                       Patient presented to the ED for evaluation of chest pain.  Patient does have history of heart disease.  Heart score is 4.  ED work-up is reassuring.  No signs of pneumonia.  Symptoms not suggestive of PE.  No signs troponin elevation to suggest cardiac ischemia.  Patient however does have cardiac risk factors.  I discussed admission to the hospital for further work-up considering his symptoms.  Patient however states he has something he needs to do tomorrow.  He does not want to be admitted to the hospital.  I explained to the patient that I cannot rule out a blockage in his heart that is causing recurrent chest pain.  Patient understands  and states he will return immediately if he has any recurrent symptoms.  We will plan on contacting his cardiologist on Monday to schedule further evaluation and testing.  I will give him a prescription for nitroglycerin. Final Clinical Impression(s) / ED Diagnoses Final diagnoses:  Chest pain, unspecified type    Rx / DC Orders ED Discharge Orders         Ordered    nitroGLYCERIN (NITROSTAT) 0.4 MG SL tablet  Every 5 min PRN        09/06/20 2009           Linwood Dibbles, MD 09/06/20 2012

## 2022-04-22 IMAGING — CR DG CHEST 2V
2 series · 2 of 2 positions shown · non-contrast
Comparison: September 27, 19

CLINICAL DATA: Shortness of breath.  Chest pain.  No fever.

EXAM:
CHEST - 2 VIEW

[w chest pa]
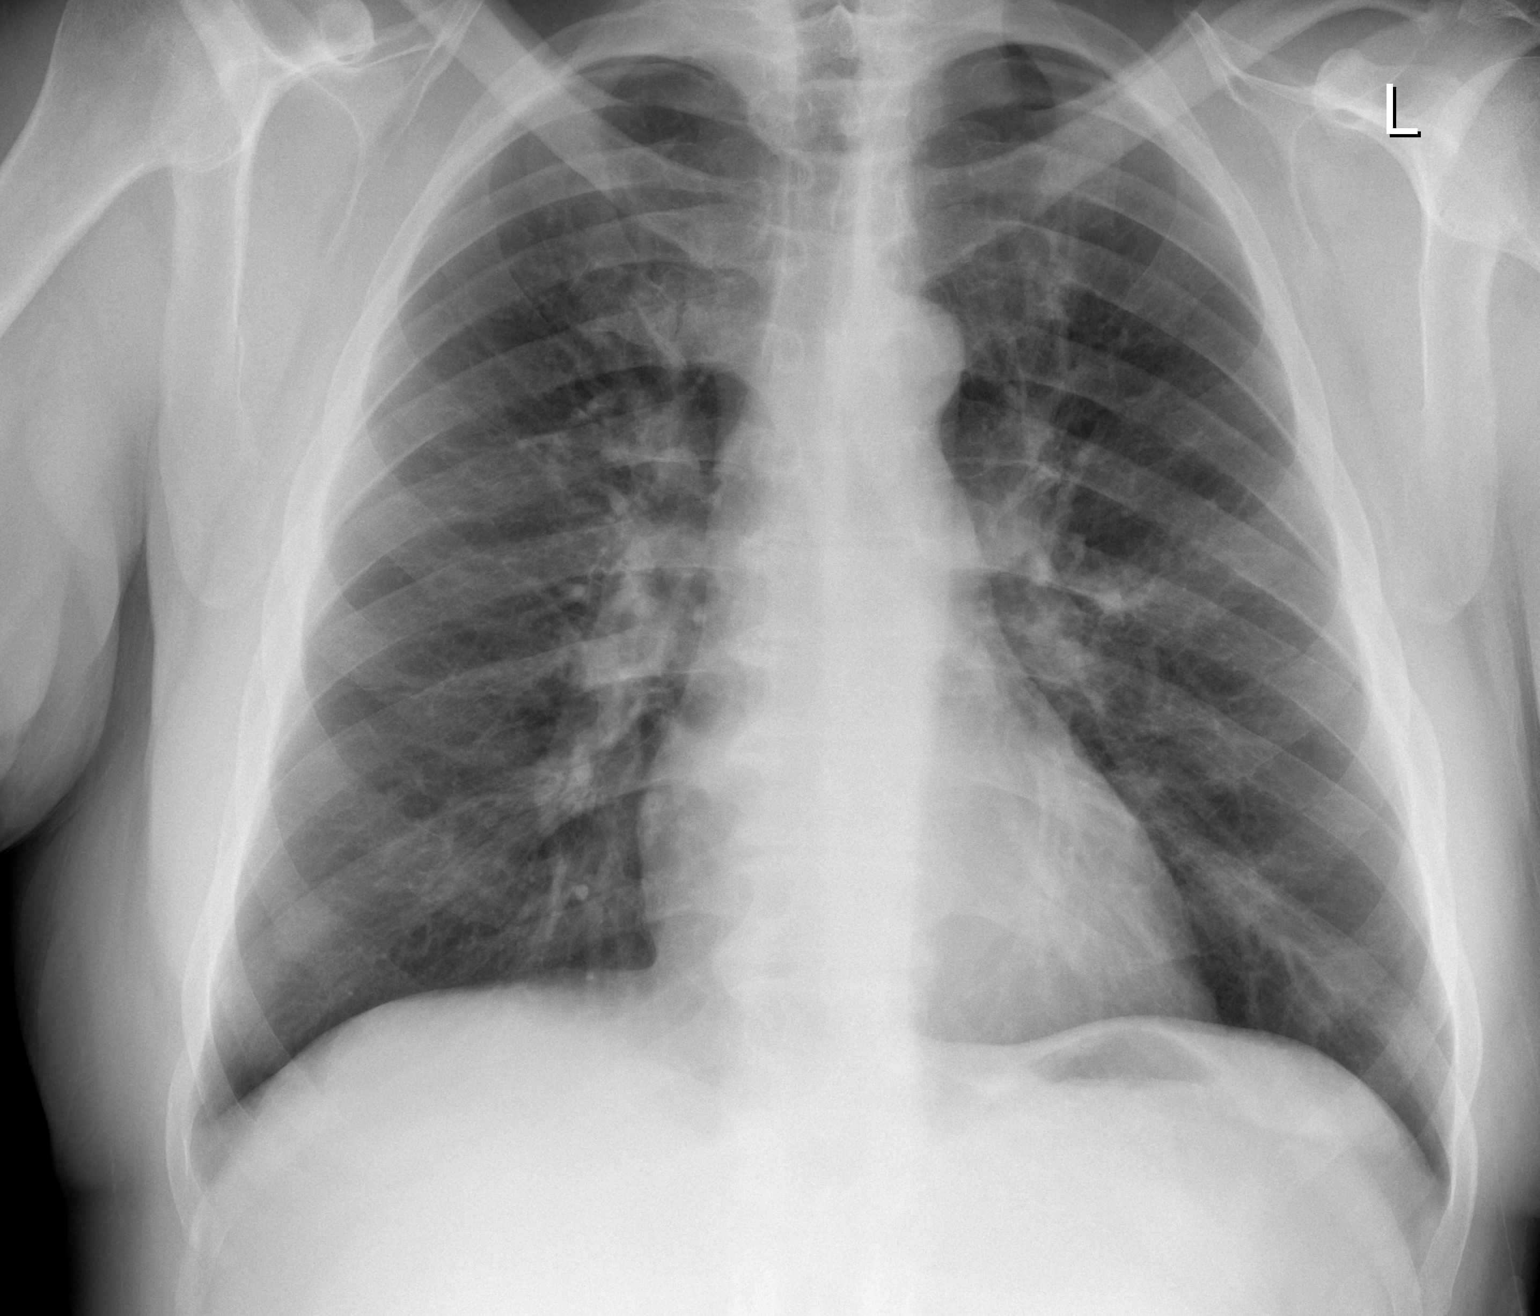

[w chest lat]
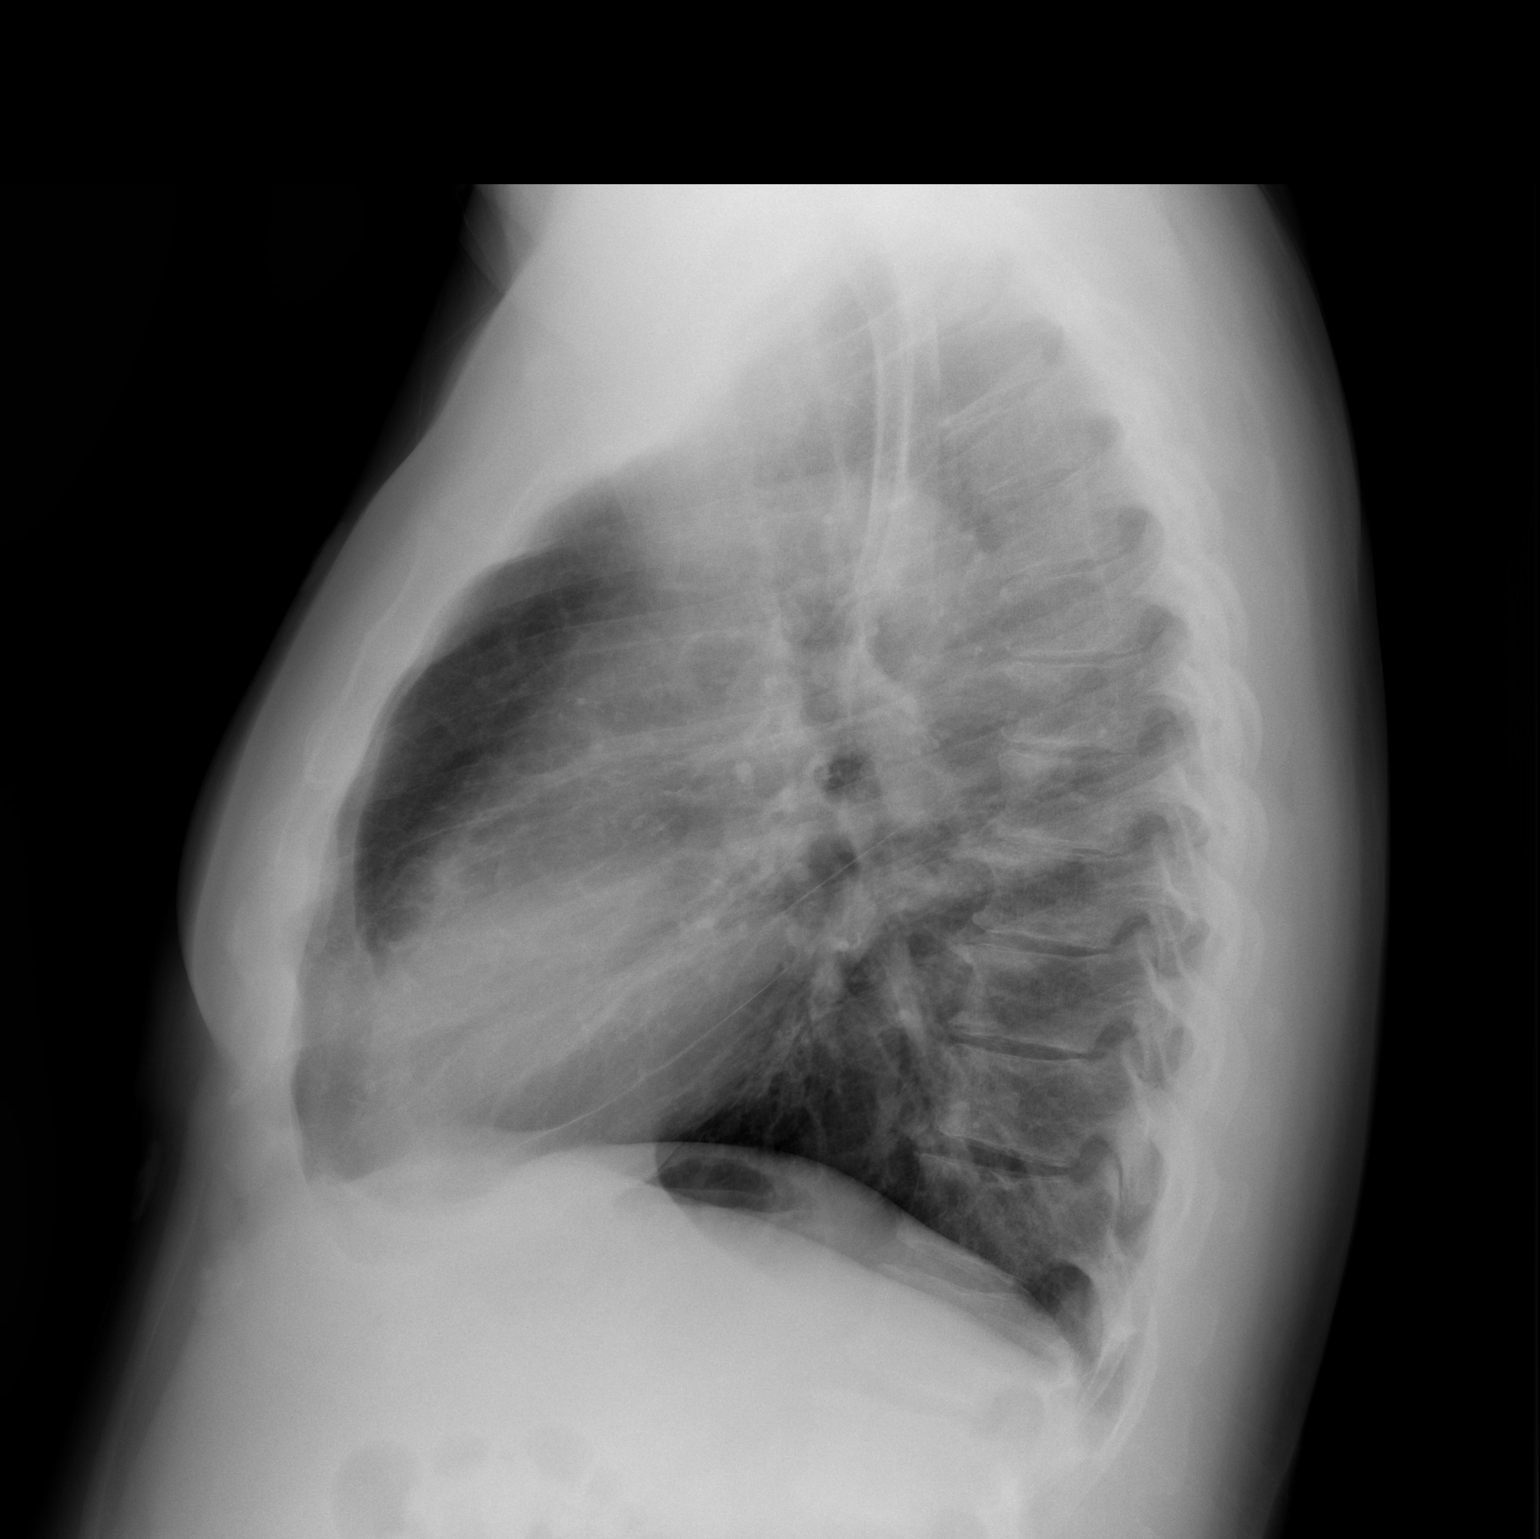

[2 of 2 positions shown; findings below may reference images not displayed]

FINDINGS: The heart size and mediastinal contours are within normal limits.
Both lungs are clear. The visualized skeletal structures are
unremarkable. There is a probable nipple shadow in the peripheral
right lower lung zone.
IMPRESSION: No active cardiopulmonary disease.

## 2022-12-29 IMAGING — DX DG CHEST 2V
2 series · 2 of 2 positions shown · non-contrast
Comparison: December 30, 2019

CLINICAL DATA: Chest pain

EXAM:
CHEST - 2 VIEW

[chest pa]
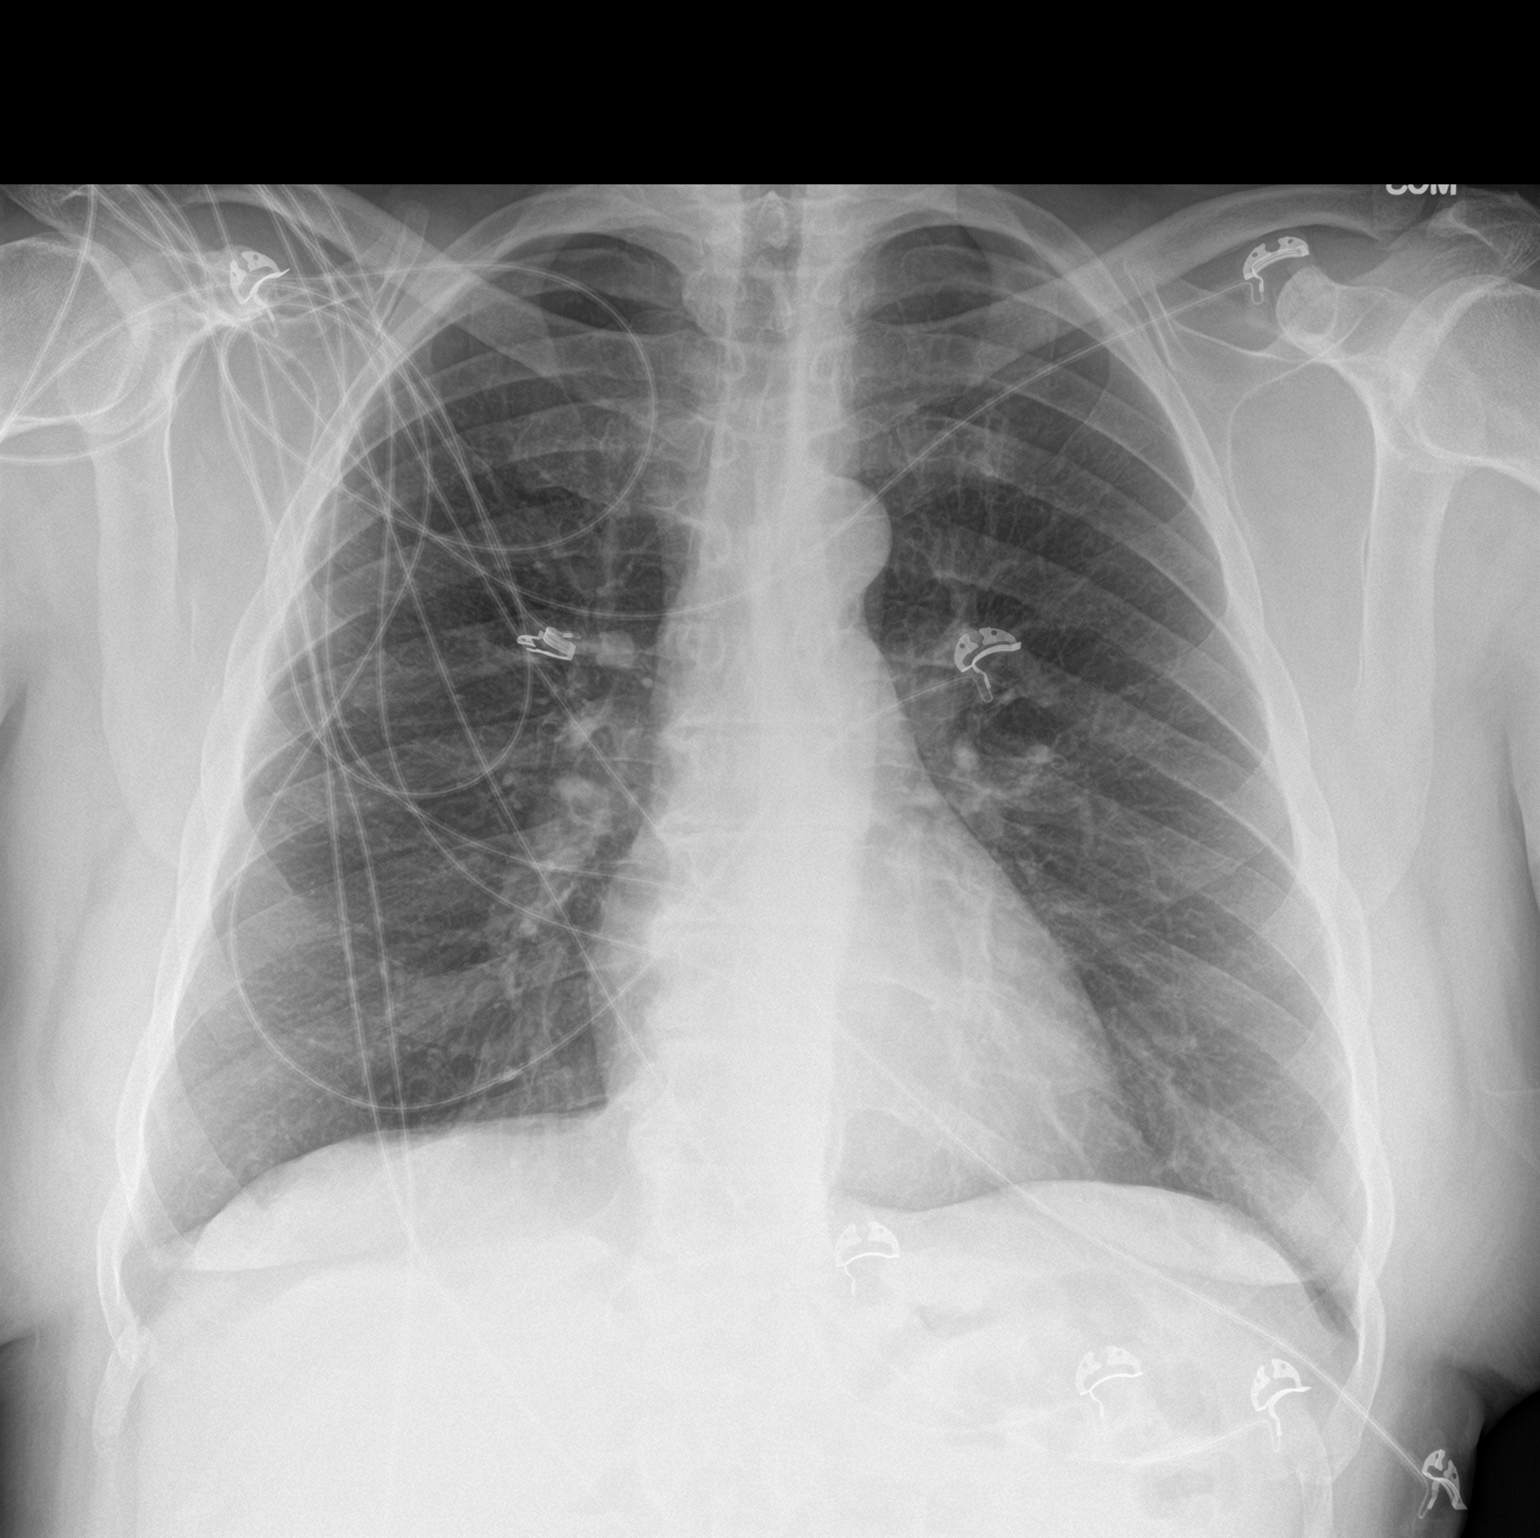

[chest lat]
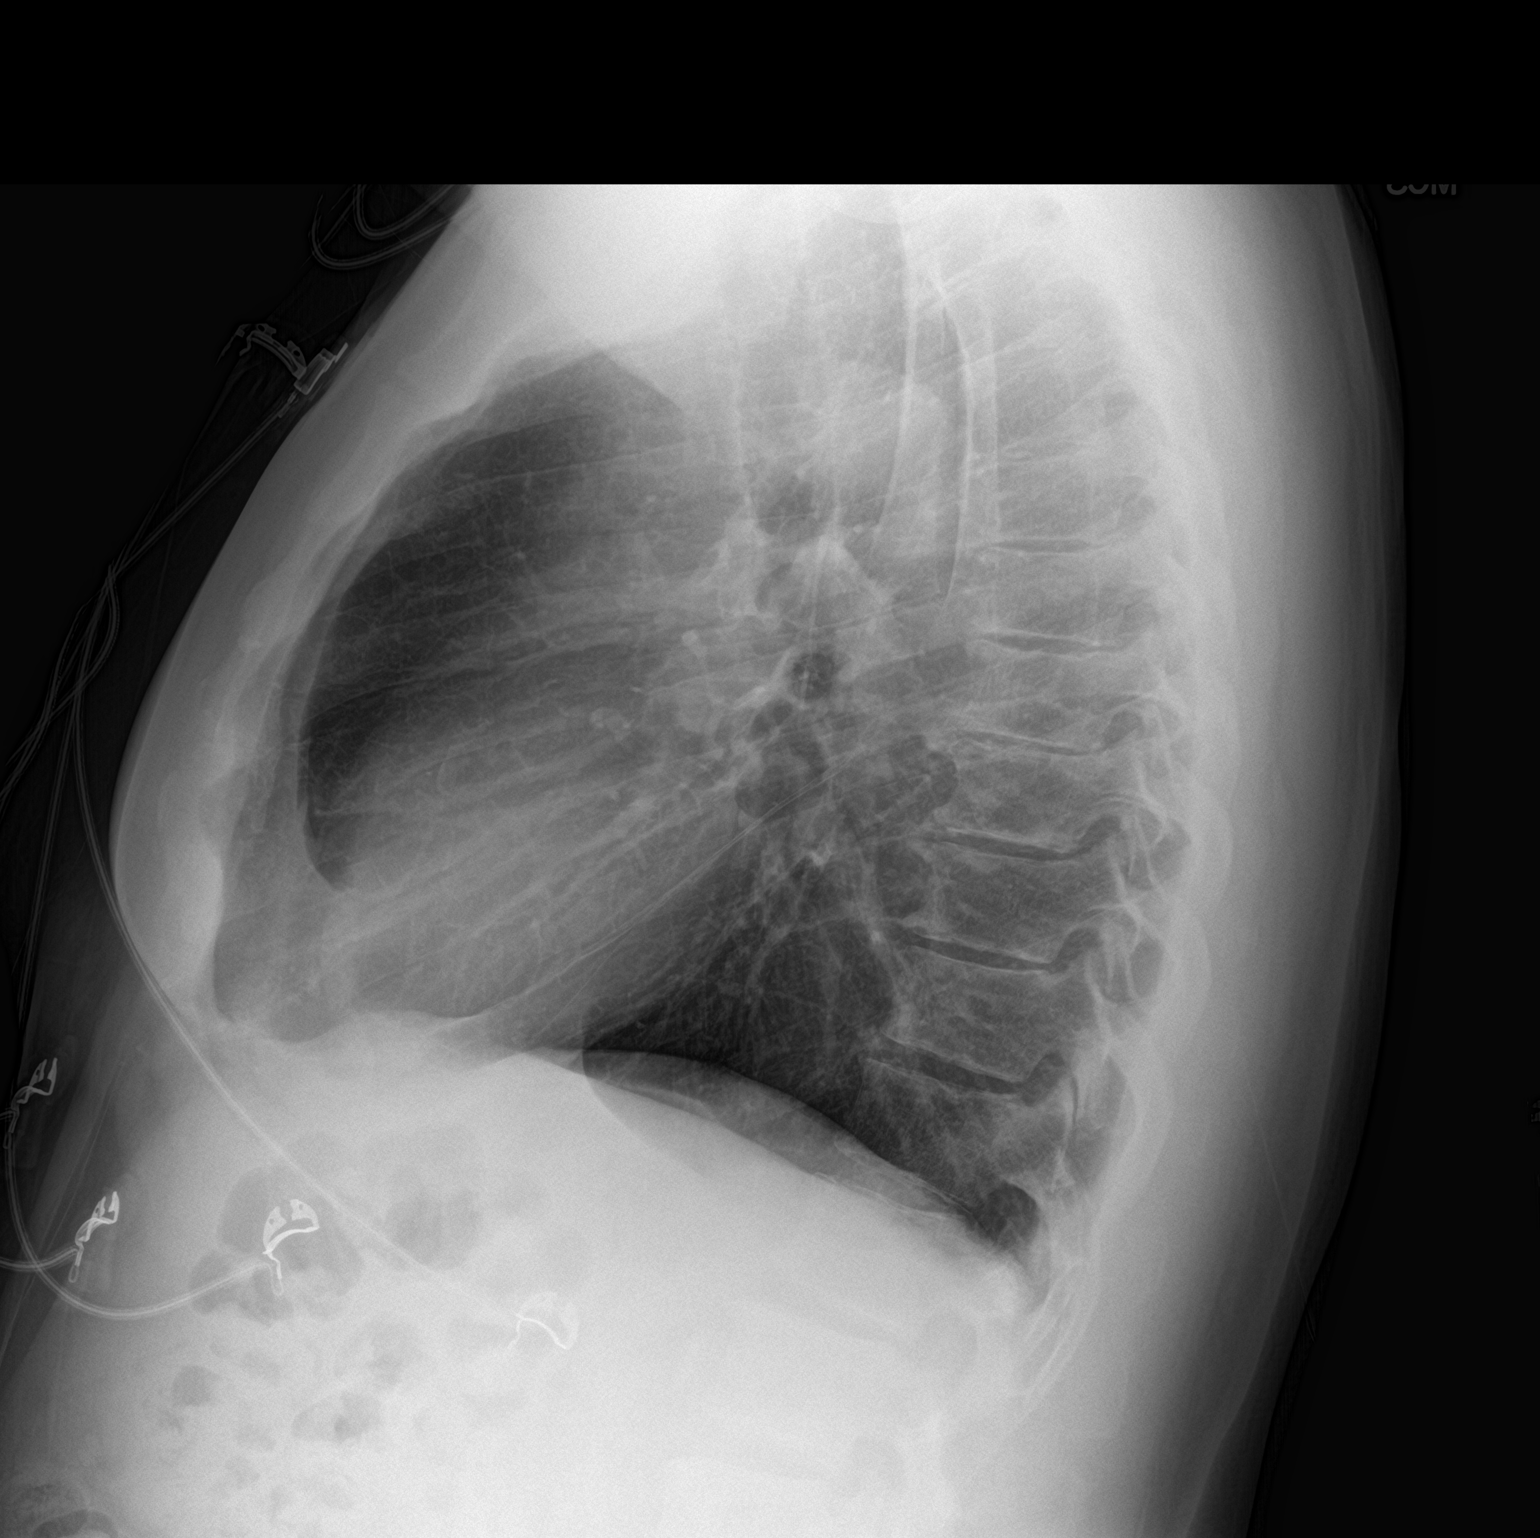

[2 of 2 positions shown; findings below may reference images not displayed]

FINDINGS: The lungs are clear. The heart size and pulmonary vascularity are
normal. No adenopathy. No pneumothorax. No bone lesions.
IMPRESSION: Lungs clear.  Cardiac silhouette normal.
